# Patient Record
Sex: Female | Born: 1935 | Race: White | Hispanic: No | Smoking: Former smoker
Health system: Southern US, Community
[De-identification: ages and names within clinical notes are randomized; demographics above are authoritative.]

## PROBLEM LIST (undated history)

## (undated) DIAGNOSIS — E119 Type 2 diabetes mellitus without complications: Secondary | ICD-10-CM

## (undated) DIAGNOSIS — I10 Essential (primary) hypertension: Secondary | ICD-10-CM

## (undated) DIAGNOSIS — I509 Heart failure, unspecified: Secondary | ICD-10-CM

## (undated) DIAGNOSIS — F0390 Unspecified dementia without behavioral disturbance: Secondary | ICD-10-CM

---

## 1997-11-24 ENCOUNTER — Ambulatory Visit (HOSPITAL_COMMUNITY): Admission: RE | Admit: 1997-11-24 | Discharge: 1997-11-24 | Payer: Self-pay | Admitting: Cardiology

## 1998-09-03 ENCOUNTER — Inpatient Hospital Stay (HOSPITAL_COMMUNITY): Admission: EM | Admit: 1998-09-03 | Discharge: 1998-09-06 | Payer: Self-pay | Admitting: Emergency Medicine

## 1998-09-03 ENCOUNTER — Encounter: Payer: Self-pay | Admitting: Cardiology

## 2004-01-12 ENCOUNTER — Ambulatory Visit: Payer: Self-pay | Admitting: Cardiology

## 2004-08-16 ENCOUNTER — Encounter: Admission: RE | Admit: 2004-08-16 | Discharge: 2004-08-16 | Payer: Self-pay | Admitting: Neurosurgery

## 2004-12-17 ENCOUNTER — Ambulatory Visit: Payer: Self-pay | Admitting: Pulmonary Disease

## 2006-02-02 ENCOUNTER — Ambulatory Visit: Payer: Self-pay | Admitting: Pulmonary Disease

## 2006-02-04 ENCOUNTER — Ambulatory Visit: Payer: Self-pay | Admitting: Pulmonary Disease

## 2006-08-25 ENCOUNTER — Ambulatory Visit: Payer: Self-pay | Admitting: Pulmonary Disease

## 2006-08-25 LAB — CONVERTED CEMR LAB
ALT: 14 units/L (ref 0–40)
AST: 20 units/L (ref 0–37)
Basophils Relative: 0.1 % (ref 0.0–1.0)
Bilirubin, Direct: 0.1 mg/dL (ref 0.0–0.3)
CO2: 29 meq/L (ref 19–32)
Calcium: 9.2 mg/dL (ref 8.4–10.5)
Chloride: 101 meq/L (ref 96–112)
Creatinine, Ser: 1 mg/dL (ref 0.4–1.2)
Eosinophils Relative: 0.5 % (ref 0.0–5.0)
Glucose, Bld: 113 mg/dL — ABNORMAL HIGH (ref 70–99)
HCT: 38.3 % (ref 36.0–46.0)
Ketones, ur: NEGATIVE mg/dL
Neutrophils Relative %: 74.4 % (ref 43.0–77.0)
Nitrite: NEGATIVE
Platelets: 209 10*3/uL (ref 150–400)
RBC: 4.15 M/uL (ref 3.87–5.11)
Specific Gravity, Urine: 1.025 (ref 1.000–1.03)
Total Bilirubin: 0.7 mg/dL (ref 0.3–1.2)
Total Protein, Urine: NEGATIVE mg/dL
Total Protein: 7 g/dL (ref 6.0–8.3)
Urine Glucose: NEGATIVE mg/dL
Urobilinogen, UA: 0.2 (ref 0.0–1.0)
WBC: 6.7 10*3/uL (ref 4.5–10.5)

## 2006-09-01 ENCOUNTER — Ambulatory Visit: Payer: Self-pay | Admitting: Pulmonary Disease

## 2006-12-24 DIAGNOSIS — C679 Malignant neoplasm of bladder, unspecified: Secondary | ICD-10-CM | POA: Insufficient documentation

## 2006-12-24 DIAGNOSIS — M1990 Unspecified osteoarthritis, unspecified site: Secondary | ICD-10-CM | POA: Insufficient documentation

## 2006-12-24 DIAGNOSIS — I2510 Atherosclerotic heart disease of native coronary artery without angina pectoris: Secondary | ICD-10-CM | POA: Insufficient documentation

## 2006-12-24 DIAGNOSIS — J209 Acute bronchitis, unspecified: Secondary | ICD-10-CM

## 2007-10-07 ENCOUNTER — Telehealth (INDEPENDENT_AMBULATORY_CARE_PROVIDER_SITE_OTHER): Payer: Self-pay | Admitting: *Deleted

## 2007-10-19 ENCOUNTER — Ambulatory Visit: Payer: Self-pay | Admitting: Internal Medicine

## 2007-10-19 ENCOUNTER — Encounter: Payer: Self-pay | Admitting: Pulmonary Disease

## 2007-10-19 LAB — CONVERTED CEMR LAB
ALT: 26 units/L (ref 0–35)
Basophils Absolute: 0.1 10*3/uL (ref 0.0–0.1)
Bilirubin, Direct: 0.1 mg/dL (ref 0.0–0.3)
CO2: 30 meq/L (ref 19–32)
Calcium: 9.2 mg/dL (ref 8.4–10.5)
Hemoglobin: 13.4 g/dL (ref 12.0–15.0)
Lymphocytes Relative: 14.8 % (ref 12.0–46.0)
MCHC: 34 g/dL (ref 30.0–36.0)
Neutro Abs: 7.5 10*3/uL (ref 1.4–7.7)
Neutrophils Relative %: 76.6 % (ref 43.0–77.0)
Platelets: 247 10*3/uL (ref 150–400)
RDW: 13 % (ref 11.5–14.6)
Sodium: 140 meq/L (ref 135–145)
TSH: 2.52 microintl units/mL (ref 0.35–5.50)
Total Bilirubin: 0.8 mg/dL (ref 0.3–1.2)

## 2007-11-03 LAB — CONVERTED CEMR LAB: Vit D, 1,25-Dihydroxy: 31 (ref 30–89)

## 2008-02-29 ENCOUNTER — Inpatient Hospital Stay (HOSPITAL_COMMUNITY): Admission: EM | Admit: 2008-02-29 | Discharge: 2008-03-07 | Payer: Self-pay | Admitting: Emergency Medicine

## 2008-02-29 ENCOUNTER — Ambulatory Visit: Payer: Self-pay | Admitting: Vascular Surgery

## 2008-02-29 ENCOUNTER — Encounter: Payer: Self-pay | Admitting: Pulmonary Disease

## 2008-02-29 ENCOUNTER — Ambulatory Visit: Payer: Self-pay | Admitting: Internal Medicine

## 2008-02-29 ENCOUNTER — Ambulatory Visit: Payer: Self-pay | Admitting: Pulmonary Disease

## 2008-03-01 ENCOUNTER — Encounter: Payer: Self-pay | Admitting: Pulmonary Disease

## 2008-03-15 ENCOUNTER — Telehealth (INDEPENDENT_AMBULATORY_CARE_PROVIDER_SITE_OTHER): Payer: Self-pay | Admitting: *Deleted

## 2008-03-20 ENCOUNTER — Ambulatory Visit: Payer: Self-pay | Admitting: Pulmonary Disease

## 2008-03-20 DIAGNOSIS — I5032 Chronic diastolic (congestive) heart failure: Secondary | ICD-10-CM | POA: Insufficient documentation

## 2008-03-20 DIAGNOSIS — S0990XA Unspecified injury of head, initial encounter: Secondary | ICD-10-CM | POA: Insufficient documentation

## 2008-03-20 DIAGNOSIS — I4891 Unspecified atrial fibrillation: Secondary | ICD-10-CM

## 2008-03-20 DIAGNOSIS — Z950 Presence of cardiac pacemaker: Secondary | ICD-10-CM

## 2008-03-20 DIAGNOSIS — I872 Venous insufficiency (chronic) (peripheral): Secondary | ICD-10-CM | POA: Insufficient documentation

## 2008-03-20 DIAGNOSIS — K219 Gastro-esophageal reflux disease without esophagitis: Secondary | ICD-10-CM

## 2008-03-20 DIAGNOSIS — F0280 Dementia in other diseases classified elsewhere without behavioral disturbance: Secondary | ICD-10-CM

## 2008-03-20 DIAGNOSIS — Q211 Atrial septal defect: Secondary | ICD-10-CM

## 2008-03-20 DIAGNOSIS — K5900 Constipation, unspecified: Secondary | ICD-10-CM | POA: Insufficient documentation

## 2008-03-20 DIAGNOSIS — G309 Alzheimer's disease, unspecified: Secondary | ICD-10-CM

## 2008-03-20 LAB — CONVERTED CEMR LAB
Basophils Relative: 4.9 % — ABNORMAL HIGH (ref 0.0–3.0)
CO2: 33 meq/L — ABNORMAL HIGH (ref 19–32)
Calcium: 9.1 mg/dL (ref 8.4–10.5)
Creatinine, Ser: 1 mg/dL (ref 0.4–1.2)
Glucose, Bld: 124 mg/dL — ABNORMAL HIGH (ref 70–99)
Hemoglobin: 13 g/dL (ref 12.0–15.0)
Lymphocytes Relative: 15.6 % (ref 12.0–46.0)
MCHC: 33.5 g/dL (ref 30.0–36.0)
Monocytes Relative: 7.8 % (ref 3.0–12.0)
Neutro Abs: 5.7 10*3/uL (ref 1.4–7.7)
RBC: 4.16 M/uL (ref 3.87–5.11)

## 2008-04-02 ENCOUNTER — Emergency Department (HOSPITAL_COMMUNITY): Admission: EM | Admit: 2008-04-02 | Discharge: 2008-04-02 | Payer: Self-pay | Admitting: Emergency Medicine

## 2008-05-25 ENCOUNTER — Ambulatory Visit: Payer: Self-pay | Admitting: Pulmonary Disease

## 2008-05-25 ENCOUNTER — Inpatient Hospital Stay (HOSPITAL_COMMUNITY): Admission: EM | Admit: 2008-05-25 | Discharge: 2008-05-29 | Payer: Self-pay | Admitting: Emergency Medicine

## 2008-05-25 ENCOUNTER — Telehealth (INDEPENDENT_AMBULATORY_CARE_PROVIDER_SITE_OTHER): Payer: Self-pay | Admitting: *Deleted

## 2008-05-26 ENCOUNTER — Telehealth: Payer: Self-pay | Admitting: Pulmonary Disease

## 2008-06-07 ENCOUNTER — Encounter: Payer: Self-pay | Admitting: Pulmonary Disease

## 2008-06-08 ENCOUNTER — Ambulatory Visit: Payer: Self-pay | Admitting: Pulmonary Disease

## 2008-06-23 ENCOUNTER — Encounter: Payer: Self-pay | Admitting: Pulmonary Disease

## 2008-09-06 ENCOUNTER — Ambulatory Visit: Payer: Self-pay | Admitting: Pulmonary Disease

## 2008-09-06 DIAGNOSIS — R350 Frequency of micturition: Secondary | ICD-10-CM

## 2008-09-07 ENCOUNTER — Encounter: Payer: Self-pay | Admitting: Pulmonary Disease

## 2008-09-15 LAB — CONVERTED CEMR LAB
ALT: 17 units/L (ref 0–35)
Alkaline Phosphatase: 95 units/L (ref 39–117)
Basophils Absolute: 0 10*3/uL (ref 0.0–0.1)
Bilirubin Urine: NEGATIVE
CO2: 34 meq/L — ABNORMAL HIGH (ref 19–32)
Calcium: 9 mg/dL (ref 8.4–10.5)
Creatinine, Ser: 1 mg/dL (ref 0.4–1.2)
Eosinophils Absolute: 0.1 10*3/uL (ref 0.0–0.7)
Glucose, Bld: 92 mg/dL (ref 70–99)
Hemoglobin: 13.1 g/dL (ref 12.0–15.0)
Ketones, ur: NEGATIVE mg/dL
Lymphocytes Relative: 20.8 % (ref 12.0–46.0)
MCHC: 34.6 g/dL (ref 30.0–36.0)
Monocytes Absolute: 0.8 10*3/uL (ref 0.1–1.0)
Neutro Abs: 5.6 10*3/uL (ref 1.4–7.7)
Neutrophils Relative %: 67.9 % (ref 43.0–77.0)
RDW: 12.9 % (ref 11.5–14.6)
Specific Gravity, Urine: 1.01 (ref 1.000–1.030)
TSH: 2.5 microintl units/mL (ref 0.35–5.50)
Total Protein: 7.4 g/dL (ref 6.0–8.3)
Urine Glucose: NEGATIVE mg/dL
pH: 7 (ref 5.0–8.0)

## 2009-01-12 ENCOUNTER — Encounter: Payer: Self-pay | Admitting: Pulmonary Disease

## 2009-06-01 ENCOUNTER — Encounter: Payer: Self-pay | Admitting: Pulmonary Disease

## 2009-06-07 ENCOUNTER — Encounter: Payer: Self-pay | Admitting: Pulmonary Disease

## 2009-10-12 ENCOUNTER — Encounter: Payer: Self-pay | Admitting: Pulmonary Disease

## 2010-04-16 NOTE — Miscellaneous (Signed)
Summary: [facility]   Imported By: Lester Shackelford 10/18/2009 09:41:58  _____________________________________________________________________  External Attachment:    Type:   Image     Comment:   External Document

## 2010-04-16 NOTE — Miscellaneous (Signed)
Summary: Physician Auth & Care Plan/[facility]  Physician Auth & Care Plan/[facility]   Imported By: Sherian Rein 06/13/2009 12:11:14  _____________________________________________________________________  External Attachment:    Type:   Image     Comment:   External Document

## 2010-04-16 NOTE — Miscellaneous (Signed)
[place] /[facility]  [place] /[facility]   Imported By: Sherian Rein 06/06/2009 13:17:18  _____________________________________________________________________  External Attachment:    Type:   Image     Comment:   External Document

## 2010-04-16 NOTE — Miscellaneous (Signed)
[place]  [place]/[facility]   Imported By: Sherian Rein 06/13/2009 12:12:12  _____________________________________________________________________  External Attachment:    Type:   Image     Comment:   External Document

## 2010-04-16 NOTE — Miscellaneous (Signed)
Summary: Care Plan/[facility]   Imported By: Sherian Rein 06/06/2009 13:18:28  _____________________________________________________________________  External Attachment:    Type:   Image     Comment:   External Document

## 2010-06-27 LAB — TROPONIN I: Troponin I: 0.24 ng/mL — ABNORMAL HIGH (ref 0.00–0.06)

## 2010-06-27 LAB — CBC
HCT: 35.3 % — ABNORMAL LOW (ref 36.0–46.0)
HCT: 35.3 % — ABNORMAL LOW (ref 36.0–46.0)
Hemoglobin: 11.7 g/dL — ABNORMAL LOW (ref 12.0–15.0)
Hemoglobin: 12.7 g/dL (ref 12.0–15.0)
MCHC: 33.2 g/dL (ref 30.0–36.0)
MCHC: 33.4 g/dL (ref 30.0–36.0)
MCV: 92.8 fL (ref 78.0–100.0)
Platelets: 196 10*3/uL (ref 150–400)
RBC: 4.14 MIL/uL (ref 3.87–5.11)
RDW: 15.1 % (ref 11.5–15.5)
RDW: 15.2 % (ref 11.5–15.5)
WBC: 7.5 10*3/uL (ref 4.0–10.5)

## 2010-06-27 LAB — COMPREHENSIVE METABOLIC PANEL
ALT: 14 U/L (ref 0–35)
AST: 17 U/L (ref 0–37)
Alkaline Phosphatase: 77 U/L (ref 39–117)
BUN: 8 mg/dL (ref 6–23)
CO2: 29 mEq/L (ref 19–32)
Calcium: 9.1 mg/dL (ref 8.4–10.5)
Creatinine, Ser: 0.93 mg/dL (ref 0.4–1.2)
GFR calc Af Amer: >60 mL/min (ref >60)
GFR calc non Af Amer: 58 mL/min — ABNORMAL LOW (ref >60)
Glucose, Bld: 117 mg/dL — ABNORMAL HIGH (ref 70–99)
Potassium: 4.1 mEq/L (ref 3.5–5.1)
Sodium: 138 mEq/L (ref 135–145)
Total Protein: 6.4 g/dL (ref 6.0–8.3)

## 2010-06-27 LAB — URINALYSIS, ROUTINE W REFLEX MICROSCOPIC
Nitrite: NEGATIVE
Protein, ur: NEGATIVE mg/dL
Urobilinogen, UA: 1 mg/dL (ref 0.0–1.0)

## 2010-06-27 LAB — DIFFERENTIAL
Basophils Relative: 0 % (ref 0–1)
Eosinophils Absolute: 0 10*3/uL (ref 0.0–0.7)
Eosinophils Relative: 2 % (ref 0–5)
Lymphocytes Relative: 22 % (ref 12–46)
Lymphs Abs: 1.6 10*3/uL (ref 0.7–4.0)
Monocytes Absolute: 0.6 10*3/uL (ref 0.1–1.0)
Monocytes Relative: 8 % (ref 3–12)
Neutro Abs: 4.7 10*3/uL (ref 1.7–7.7)
Neutrophils Relative %: 77 % (ref 43–77)

## 2010-06-27 LAB — CULTURE, BLOOD (ROUTINE X 2): Culture: NO GROWTH

## 2010-06-27 LAB — BASIC METABOLIC PANEL
CO2: 28 mEq/L (ref 19–32)
Chloride: 103 mEq/L (ref 96–112)
Glucose, Bld: 101 mg/dL — ABNORMAL HIGH (ref 70–99)
Potassium: 3.6 mEq/L (ref 3.5–5.1)
Sodium: 138 mEq/L (ref 135–145)

## 2010-06-27 LAB — URINE CULTURE

## 2010-06-27 LAB — PHOSPHORUS: Phosphorus: 2.6 mg/dL (ref 2.3–4.6)

## 2010-06-27 LAB — URINE MICROSCOPIC-ADD ON

## 2010-06-27 LAB — CK TOTAL AND CKMB (NOT AT ARMC): Total CK: 46 U/L (ref 7–177)

## 2010-07-26 ENCOUNTER — Emergency Department (HOSPITAL_COMMUNITY)
Admission: EM | Admit: 2010-07-26 | Discharge: 2010-07-27 | Disposition: A | Discharge status: Home / Self Care | Payer: Medicare Other | Attending: Emergency Medicine | Admitting: Emergency Medicine

## 2010-07-26 DIAGNOSIS — I509 Heart failure, unspecified: Secondary | ICD-10-CM | POA: Insufficient documentation

## 2010-07-26 DIAGNOSIS — H109 Unspecified conjunctivitis: Secondary | ICD-10-CM | POA: Insufficient documentation

## 2010-07-26 DIAGNOSIS — Z7982 Long term (current) use of aspirin: Secondary | ICD-10-CM | POA: Insufficient documentation

## 2010-07-26 DIAGNOSIS — Z79899 Other long term (current) drug therapy: Secondary | ICD-10-CM | POA: Insufficient documentation

## 2010-07-26 DIAGNOSIS — E7800 Pure hypercholesterolemia, unspecified: Secondary | ICD-10-CM | POA: Insufficient documentation

## 2010-07-26 DIAGNOSIS — Z950 Presence of cardiac pacemaker: Secondary | ICD-10-CM | POA: Insufficient documentation

## 2010-07-26 DIAGNOSIS — I4891 Unspecified atrial fibrillation: Secondary | ICD-10-CM | POA: Insufficient documentation

## 2010-07-26 DIAGNOSIS — Z951 Presence of aortocoronary bypass graft: Secondary | ICD-10-CM | POA: Insufficient documentation

## 2010-07-26 DIAGNOSIS — F0390 Unspecified dementia without behavioral disturbance: Secondary | ICD-10-CM | POA: Insufficient documentation

## 2010-07-26 DIAGNOSIS — K219 Gastro-esophageal reflux disease without esophagitis: Secondary | ICD-10-CM | POA: Insufficient documentation
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
## 2010-12-20 LAB — CBC
HCT: 37.3 % (ref 36.0–46.0)
Hemoglobin: 12 g/dL (ref 12.0–15.0)
Hemoglobin: 12.3 g/dL (ref 12.0–15.0)
Hemoglobin: 12.5 g/dL (ref 12.0–15.0)
MCHC: 32.1 g/dL (ref 30.0–36.0)
MCHC: 32.2 g/dL (ref 30.0–36.0)
MCHC: 32.4 g/dL (ref 30.0–36.0)
MCHC: 32.7 g/dL (ref 30.0–36.0)
MCV: 93.7 fL (ref 78.0–100.0)
MCV: 93.7 fL (ref 78.0–100.0)
MCV: 93.8 fL (ref 78.0–100.0)
MCV: 93.9 fL (ref 78.0–100.0)
Platelets: 242 10*3/uL (ref 150–400)
Platelets: 253 10*3/uL (ref 150–400)
Platelets: 256 10*3/uL (ref 150–400)
Platelets: 258 10*3/uL (ref 150–400)
RBC: 3.79 MIL/uL — ABNORMAL LOW (ref 3.87–5.11)
RBC: 3.97 MIL/uL (ref 3.87–5.11)
RBC: 3.98 MIL/uL (ref 3.87–5.11)
RBC: 4.05 MIL/uL (ref 3.87–5.11)
RDW: 13.9 % (ref 11.5–15.5)
RDW: 14.3 % (ref 11.5–15.5)
WBC: 6.2 10*3/uL (ref 4.0–10.5)
WBC: 7.3 10*3/uL (ref 4.0–10.5)
WBC: 7.5 10*3/uL (ref 4.0–10.5)
WBC: 7.9 10*3/uL (ref 4.0–10.5)

## 2010-12-20 LAB — URINALYSIS, ROUTINE W REFLEX MICROSCOPIC
Bilirubin Urine: NEGATIVE
Glucose, UA: NEGATIVE mg/dL
Ketones, ur: NEGATIVE mg/dL
Nitrite: NEGATIVE
Protein, ur: 30 mg/dL — AB
Specific Gravity, Urine: 1.021 (ref 1.005–1.030)
Urobilinogen, UA: 1 mg/dL (ref 0.0–1.0)
pH: 6 (ref 5.0–8.0)

## 2010-12-20 LAB — BASIC METABOLIC PANEL
BUN: 16 mg/dL (ref 6–23)
BUN: 22 mg/dL (ref 6–23)
BUN: 22 mg/dL (ref 6–23)
CO2: 31 mEq/L (ref 19–32)
CO2: 34 mEq/L — ABNORMAL HIGH (ref 19–32)
Calcium: 8.8 mg/dL (ref 8.4–10.5)
Calcium: 8.9 mg/dL (ref 8.4–10.5)
Calcium: 8.9 mg/dL (ref 8.4–10.5)
Calcium: 8.9 mg/dL (ref 8.4–10.5)
Chloride: 103 mEq/L (ref 96–112)
Chloride: 99 mEq/L (ref 96–112)
Creatinine, Ser: 1.03 mg/dL (ref 0.4–1.2)
Creatinine, Ser: 1.05 mg/dL (ref 0.4–1.2)
Creatinine, Ser: 1.05 mg/dL (ref 0.4–1.2)
Creatinine, Ser: 1.13 mg/dL (ref 0.4–1.2)
GFR calc Af Amer: 56 mL/min — ABNORMAL LOW (ref >60)
GFR calc Af Amer: 59 mL/min — ABNORMAL LOW (ref >60)
GFR calc Af Amer: >60 mL/min (ref >60)
GFR calc non Af Amer: 46 mL/min — ABNORMAL LOW (ref >60)
GFR calc non Af Amer: 49 mL/min — ABNORMAL LOW (ref >60)
Potassium: 3.7 mEq/L (ref 3.5–5.1)
Sodium: 136 mEq/L (ref 135–145)

## 2010-12-20 LAB — POCT I-STAT, CHEM 8
BUN: 17 mg/dL (ref 6–23)
Calcium, Ion: 1.21 mmol/L (ref 1.12–1.32)
Chloride: 102 meq/L (ref 96–112)
Creatinine, Ser: 1 mg/dL (ref 0.4–1.2)
Glucose, Bld: 103 mg/dL — ABNORMAL HIGH (ref 70–99)
HCT: 39 % (ref 36.0–46.0)
Hemoglobin: 13.3 g/dL (ref 12.0–15.0)
Potassium: 4.4 meq/L (ref 3.5–5.1)
Sodium: 141 meq/L (ref 135–145)
TCO2: 30 mmol/L (ref 0–100)

## 2010-12-20 LAB — HEPATIC FUNCTION PANEL
AST: 26 U/L (ref 0–37)
Albumin: 3.2 g/dL — ABNORMAL LOW (ref 3.5–5.2)
Bilirubin, Direct: 0.1 mg/dL (ref 0.0–0.3)
Total Bilirubin: 0.4 mg/dL (ref 0.3–1.2)

## 2010-12-20 LAB — PROTIME-INR
INR: 1 (ref 0.00–1.49)
INR: 1.1 (ref 0.00–1.49)
INR: 1.2 (ref 0.00–1.49)
INR: 1.6 — ABNORMAL HIGH (ref 0.00–1.49)
Prothrombin Time: 13.6 seconds (ref 11.6–15.2)
Prothrombin Time: 14.2 seconds (ref 11.6–15.2)
Prothrombin Time: 15.7 seconds — ABNORMAL HIGH (ref 11.6–15.2)

## 2010-12-20 LAB — URINE MICROSCOPIC-ADD ON

## 2010-12-20 LAB — APTT: aPTT: 37 seconds (ref 24–37)

## 2010-12-20 LAB — DIGOXIN LEVEL: Digoxin Level: <0.2 ng/mL — ABNORMAL LOW (ref 0.8–2.0)

## 2010-12-20 LAB — B-NATRIURETIC PEPTIDE (CONVERTED LAB)
Pro B Natriuretic peptide (BNP): 175 pg/mL — ABNORMAL HIGH (ref 0.0–100.0)
Pro B Natriuretic peptide (BNP): 231 pg/mL — ABNORMAL HIGH (ref 0.0–100.0)
Pro B Natriuretic peptide (BNP): 462 pg/mL — ABNORMAL HIGH (ref 0.0–100.0)

## 2010-12-20 LAB — POCT CARDIAC MARKERS
CKMB, poc: 1.6 ng/mL (ref 1.0–8.0)
Myoglobin, poc: 84.3 ng/mL (ref 12–200)
Troponin i, poc: <0.05 ng/mL (ref 0.00–0.09)

## 2010-12-20 LAB — TSH: TSH: 5.144 u[IU]/mL — ABNORMAL HIGH (ref 0.350–4.500)

## 2010-12-20 LAB — T3: T3, Total: 143.1 ng/dl (ref 80.0–204.0)

## 2013-05-09 ENCOUNTER — Inpatient Hospital Stay (HOSPITAL_COMMUNITY)
Admission: EM | Admit: 2013-05-09 | Discharge: 2013-05-15 | DRG: 291 | Disposition: E | Payer: Medicare Other | Attending: Internal Medicine | Admitting: Internal Medicine

## 2013-05-09 ENCOUNTER — Encounter (HOSPITAL_COMMUNITY): Payer: Self-pay | Admitting: Emergency Medicine

## 2013-05-09 ENCOUNTER — Emergency Department (HOSPITAL_COMMUNITY): Payer: Medicare Other

## 2013-05-09 DIAGNOSIS — C679 Malignant neoplasm of bladder, unspecified: Secondary | ICD-10-CM

## 2013-05-09 DIAGNOSIS — Z7982 Long term (current) use of aspirin: Secondary | ICD-10-CM

## 2013-05-09 DIAGNOSIS — Q2111 Secundum atrial septal defect: Secondary | ICD-10-CM

## 2013-05-09 DIAGNOSIS — I4891 Unspecified atrial fibrillation: Secondary | ICD-10-CM | POA: Diagnosis present

## 2013-05-09 DIAGNOSIS — F0280 Dementia in other diseases classified elsewhere without behavioral disturbance: Secondary | ICD-10-CM | POA: Diagnosis present

## 2013-05-09 DIAGNOSIS — Z87891 Personal history of nicotine dependence: Secondary | ICD-10-CM

## 2013-05-09 DIAGNOSIS — G309 Alzheimer's disease, unspecified: Secondary | ICD-10-CM | POA: Diagnosis present

## 2013-05-09 DIAGNOSIS — I5033 Acute on chronic diastolic (congestive) heart failure: Principal | ICD-10-CM | POA: Diagnosis present

## 2013-05-09 DIAGNOSIS — Z79899 Other long term (current) drug therapy: Secondary | ICD-10-CM

## 2013-05-09 DIAGNOSIS — I10 Essential (primary) hypertension: Secondary | ICD-10-CM | POA: Diagnosis present

## 2013-05-09 DIAGNOSIS — Z66 Do not resuscitate: Secondary | ICD-10-CM | POA: Diagnosis present

## 2013-05-09 DIAGNOSIS — I872 Venous insufficiency (chronic) (peripheral): Secondary | ICD-10-CM

## 2013-05-09 DIAGNOSIS — J189 Pneumonia, unspecified organism: Secondary | ICD-10-CM | POA: Diagnosis present

## 2013-05-09 DIAGNOSIS — E119 Type 2 diabetes mellitus without complications: Secondary | ICD-10-CM | POA: Diagnosis present

## 2013-05-09 DIAGNOSIS — I509 Heart failure, unspecified: Secondary | ICD-10-CM | POA: Diagnosis present

## 2013-05-09 DIAGNOSIS — K219 Gastro-esophageal reflux disease without esophagitis: Secondary | ICD-10-CM | POA: Diagnosis present

## 2013-05-09 DIAGNOSIS — Q211 Atrial septal defect: Secondary | ICD-10-CM

## 2013-05-09 DIAGNOSIS — Z950 Presence of cardiac pacemaker: Secondary | ICD-10-CM

## 2013-05-09 HISTORY — DX: Unspecified dementia, unspecified severity, without behavioral disturbance, psychotic disturbance, mood disturbance, and anxiety: F03.90

## 2013-05-09 HISTORY — DX: Type 2 diabetes mellitus without complications: E11.9

## 2013-05-09 HISTORY — DX: Essential (primary) hypertension: I10

## 2013-05-09 HISTORY — DX: Heart failure, unspecified: I50.9

## 2013-05-09 LAB — COMPREHENSIVE METABOLIC PANEL
ALK PHOS: 83 U/L (ref 39–117)
ALT: 21 U/L (ref 0–35)
AST: 20 U/L (ref 0–37)
Albumin: 2.9 g/dL — ABNORMAL LOW (ref 3.5–5.2)
BILIRUBIN TOTAL: 0.7 mg/dL (ref 0.3–1.2)
BUN: 20 mg/dL (ref 6–23)
CO2: 29 meq/L (ref 19–32)
Calcium: 9.1 mg/dL (ref 8.4–10.5)
Chloride: 101 mEq/L (ref 96–112)
Creatinine, Ser: 0.7 mg/dL (ref 0.50–1.10)
GFR, EST AFRICAN AMERICAN: 88 mL/min — AB (ref >90)
GFR, EST NON AFRICAN AMERICAN: 76 mL/min — AB (ref >90)
GLUCOSE: 109 mg/dL — AB (ref 70–99)
POTASSIUM: 4.6 meq/L (ref 3.7–5.3)
SODIUM: 142 meq/L (ref 137–147)
Total Protein: 7.4 g/dL (ref 6.0–8.3)

## 2013-05-09 LAB — CBC WITH DIFFERENTIAL/PLATELET
Basophils Absolute: 0 10*3/uL (ref 0.0–0.1)
Basophils Relative: 0 % (ref 0–1)
Eosinophils Absolute: 0 10*3/uL (ref 0.0–0.7)
Eosinophils Relative: 0 % (ref 0–5)
HCT: 36.3 % (ref 36.0–46.0)
HEMOGLOBIN: 11.6 g/dL — AB (ref 12.0–15.0)
LYMPHS ABS: 0.9 10*3/uL (ref 0.7–4.0)
LYMPHS PCT: 5 % — AB (ref 12–46)
MCH: 29.9 pg (ref 26.0–34.0)
MCHC: 32 g/dL (ref 30.0–36.0)
MCV: 93.6 fL (ref 78.0–100.0)
MONOS PCT: 10 % (ref 3–12)
Monocytes Absolute: 1.7 10*3/uL — ABNORMAL HIGH (ref 0.1–1.0)
NEUTROS PCT: 85 % — AB (ref 43–77)
Neutro Abs: 14.2 10*3/uL — ABNORMAL HIGH (ref 1.7–7.7)
PLATELETS: 253 10*3/uL (ref 150–400)
RBC: 3.88 MIL/uL (ref 3.87–5.11)
RDW: 16.1 % — ABNORMAL HIGH (ref 11.5–15.5)
WBC: 16.8 10*3/uL — AB (ref 4.0–10.5)

## 2013-05-09 LAB — URINALYSIS, ROUTINE W REFLEX MICROSCOPIC
BILIRUBIN URINE: NEGATIVE
GLUCOSE, UA: NEGATIVE mg/dL
KETONES UR: NEGATIVE mg/dL
Nitrite: NEGATIVE
PROTEIN: NEGATIVE mg/dL
Specific Gravity, Urine: 1.017 (ref 1.005–1.030)
UROBILINOGEN UA: 1 mg/dL (ref 0.0–1.0)
pH: 5.5 (ref 5.0–8.0)

## 2013-05-09 LAB — URINE MICROSCOPIC-ADD ON

## 2013-05-09 LAB — TROPONIN I: Troponin I: <0.3 ng/mL (ref <0.30)

## 2013-05-09 LAB — DIGOXIN LEVEL: Digoxin Level: 0.7 ng/mL — ABNORMAL LOW (ref 0.8–2.0)

## 2013-05-09 LAB — PRO B NATRIURETIC PEPTIDE: PRO B NATRI PEPTIDE: 2845 pg/mL — AB (ref 0–450)

## 2013-05-09 MED ORDER — DIGOXIN 125 MCG PO TABS
0.2500 mg | ORAL_TABLET | Freq: Once | ORAL | Status: AC
  Administered 2013-05-09: 0.25 mg via ORAL
  Filled 2013-05-09: qty 2

## 2013-05-09 MED ORDER — VANCOMYCIN HCL IN DEXTROSE 750-5 MG/150ML-% IV SOLN
750.0000 mg | INTRAVENOUS | Status: DC
  Administered 2013-05-09: 750 mg via INTRAVENOUS
  Filled 2013-05-09 (×2): qty 150

## 2013-05-09 MED ORDER — FUROSEMIDE 10 MG/ML IJ SOLN
40.0000 mg | Freq: Once | INTRAMUSCULAR | Status: AC
  Administered 2013-05-09: 40 mg via INTRAVENOUS
  Filled 2013-05-09: qty 4

## 2013-05-09 MED ORDER — DEXTROSE 5 % IV SOLN
1.0000 g | INTRAVENOUS | Status: DC
  Administered 2013-05-09: 1 g via INTRAVENOUS
  Filled 2013-05-09 (×3): qty 1

## 2013-05-09 MED ORDER — DILTIAZEM HCL 25 MG/5ML IV SOLN
10.0000 mg | Freq: Once | INTRAVENOUS | Status: AC
  Administered 2013-05-09: 10 mg via INTRAVENOUS
  Filled 2013-05-09: qty 5

## 2013-05-09 NOTE — ED Provider Notes (Addendum)
CSN: [id number]     Arrival date & time 04/23/2013  1757 History   First MD Initiated Contact with Patient 04/24/2013 1801     Chief Complaint  Patient presents with  . Leg Swelling     (Consider location/radiation/quality/duration/timing/severity/associated sxs/prior Treatment) HPI Comments: Patient presents to the ER for evaluation of bilateral lower extremity swelling. Symptoms present for an unknown period of time. She is sent to the ER from the nursing home where she resides. Patient has also been noted to have discoloration of the fingertips on her hands.  Patient does have a history of dementia, is not a very good historian. She currently denies chest pain, shortness of breath, hand pain, leg pain. Level V Caveat due to Dementia.   No past medical history on file. No past surgical history on file. No family history on file. History  Substance Use Topics  . Smoking status: Not on file  . Smokeless tobacco: Not on file  . Alcohol Use: Not on file   OB History   No data available     Review of Systems  Unable to perform ROS: Dementia      Allergies  Codeine  Home Medications   Current Outpatient Rx  Name  Route  Sig  Dispense  Refill  . aspirin EC 81 MG tablet   Oral   Take 81 mg by mouth daily.         . digoxin (LANOXIN) 0.125 MG tablet   Oral   Take 0.125 mg by mouth daily.         . furosemide (LASIX) 20 MG tablet   Oral   Take 20 mg by mouth daily.         . hydrocerin (EUCERIN) CREA   Topical   Apply 1 application topically 2 (two) times daily. To legs and feet         . Melatonin 3 MG TABS   Oral   Take 1 tablet by mouth at bedtime.         . metFORMIN (GLUCOPHAGE) 500 MG tablet   Oral   Take 500 mg by mouth 2 (two) times daily with a meal.         . Multiple Vitamin (TAB-A-VITE PO)   Oral   Take 1 tablet by mouth daily.         . potassium chloride SA (K-DUR,KLOR-CON) 20 MEQ tablet   Oral   Take 20 mEq by mouth 2 (two) times  daily.         . verapamil (CALAN-SR) 240 MG CR tablet   Oral   Take 240 mg by mouth daily.          BP 118/65  Pulse 106  Temp(Src) 98.1 F (36.7 C) (Oral)  Resp 24  Ht 5' 1.81" (1.57 m)  Wt 120 lb (54.432 kg)  BMI 22.08 kg/m2  SpO2 97% Physical Exam  Constitutional: She is oriented to person, place, and time. She appears well-developed and well-nourished. No distress.  HENT:  Head: Normocephalic and atraumatic.  Right Ear: Hearing normal.  Left Ear: Hearing normal.  Nose: Nose normal.  Mouth/Throat: Oropharynx is clear and moist and mucous membranes are normal.  Eyes: Conjunctivae and EOM are normal. Pupils are equal, round, and reactive to light.  Neck: Normal range of motion. Neck supple.  Cardiovascular: S1 normal and S2 normal.  An irregularly irregular rhythm present. Tachycardia present.  Exam reveals no gallop and no friction rub.   No murmur  heard. Pulmonary/Chest: Accessory muscle usage present. Tachypnea noted. She has rales in the left lower field. She exhibits no tenderness.  Abdominal: Soft. Normal appearance and bowel sounds are normal. There is no hepatosplenomegaly. There is no tenderness. There is no rebound, no guarding, no tenderness at McBurney's point and negative Murphy's sign. No hernia.  Musculoskeletal: Normal range of motion. She exhibits edema (2-3+ bilateral).  Neurological: She is alert and oriented to person, place, and time. She has normal strength. No cranial nerve deficit or sensory deficit. Coordination normal. GCS eye subscore is 4. GCS verbal subscore is 5. GCS motor subscore is 6.  Skin: Skin is warm, dry and intact. No rash noted. No cyanosis.     Psychiatric: She has a normal mood and affect. Her speech is normal and behavior is normal. Thought content normal.    ED Course  Procedures (including critical care time) Labs Review Labs Reviewed  CBC WITH DIFFERENTIAL - Abnormal; Notable for the following:    WBC 16.8 (*)     Hemoglobin 11.6 (*)    RDW 16.1 (*)    Neutrophils Relative % 85 (*)    Neutro Abs 14.2 (*)    Lymphocytes Relative 5 (*)    Monocytes Absolute 1.7 (*)    All other components within normal limits  COMPREHENSIVE METABOLIC PANEL - Abnormal; Notable for the following:    Glucose, Bld 109 (*)    Albumin 2.9 (*)    GFR calc non Af Amer 76 (*)    GFR calc Af Amer 88 (*)    All other components within normal limits  PRO B NATRIURETIC PEPTIDE - Abnormal; Notable for the following:    Pro B Natriuretic peptide (BNP) 2845.0 (*)    All other components within normal limits  CULTURE, BLOOD (ROUTINE X 2)  CULTURE, BLOOD (ROUTINE X 2)  TROPONIN I  URINALYSIS, ROUTINE W REFLEX MICROSCOPIC  DIGOXIN LEVEL   Imaging Review Dg Chest 2 View  04/21/2013   CLINICAL DATA:  Lower extremity edema. Discoloration of the finger tips. Lung crackles. Dementia.  EXAM: CHEST  2 VIEW  COMPARISON:  DG CHEST 2 VIEW dated 05/25/2008; DG CHEST 1V PORT dated 02/28/2008; CT HEAD W/O CM dated 05/25/2008  FINDINGS: Moderate cardiomegaly noted. There is new airspace opacity in the left lower lobe obscuring the left hemidiaphragm. There is potentially a small left pleural effusion. Kerley B-lines are noted peripherally in the lungs, and there is a small amount of fluid in the minor fissure.  Prior median sternotomy. Dual lead pacer noted. Bony demineralization.  IMPRESSION: 1. Left lower lobe airspace opacity potentially from pneumonia or less likely confluent edema with left pleural effusion. Malignancy is not excluded, and followup radiography in the next 4 weeks time is recommended in order to ensure clearance after appropriate treatment. 2. Kerley B-lines in the lungs suggesting mild interstitial edema. Cardiomegaly noted.   Electronically Signed   By: Sherryl Barters M.D.   On: 05/03/2013 19:55    EKG Interpretation    Date/Time:  Monday May 09 2013 18:26:12 EST Ventricular Rate:  109 PR Interval:    QRS  Duration: 94 QT Interval:  269 QTC Calculation: 362 R Axis:   110 Text Interpretation:  Atrial fibrillation Probable RVH w/ secondary repol abnormality Incomplete right bundle branch block Nonspecific ST and T wave abnormality No significant change since last tracing Confirmed by Horris Speros  MD, Mckinzie Saksa ([id number]) on 04/18/2013 9:23:54 PM  MDM   Final diagnoses:  HCAP (healthcare-associated pneumonia)  CHF (congestive heart failure)  Atrial fibrillation with RVR    Patient presents to the ER for evaluation of swelling of her legs. She appears to be short of breath, though she denies it. She does have baseline dementia. She was tachypneic and had increased accessory muscle use with her breathing. She is not, however, hypoxic.  Patient's workup is consistent with acutely decompensated congestive heart failure with concomitant left lower lobe pneumonia. Patient currently resides in a nursing home, will be treated for healthcare associated pneumonia. She was also given additional Lasix.  Patient was found to be in atrial fibrillation with rapid ventricular response. She was borderline hypotensive febrile, but did tolerate a small bolus of Cardizem. Heart rate is now around 100 and her blood pressure is 161 systolic. I do not feel that she requires a Cardizem drip at this point. Digoxin level was low, was given by mouth digoxin  Addendum: Nursing staff to contact the patient's nursing home. Patient is a DO NOT RESUSCITATE. The DO NOT RESUSCITATE order was inadvertently left behind by EMS when they picked her up.  Orpah Greek, MD 2013-06-01 0960  Orpah Greek, MD June 01, 2013 [phone]

## 2013-05-09 NOTE — ED Notes (Addendum)
Pt reports to the ED for eva of bilateral leg swelling and purple discoloration to her fingertips. Capillary refill >3 seconds. Pt atrial fibrillation at a rate of 110s-120s on the monitor. Pt has +2 pitting edema in bilateral lower extremities. Crackles auscultated on left lower lung base. Denies cough. VSS en route. Pt is from [facility]. Pt does take Lasix. Pt ambulatory PTA. CHF is not listed in her medical hx. Pt alert and oriented at baseline. Pt has hx of dementia. Per SNF pt had crackles in the lower lung fields. Denies any CP or SOB. Denies any pain. Resp e/u and skin warm and dry.

## 2013-05-09 NOTE — Consult Note (Signed)
ANTIBIOTIC CONSULT NOTE - INITIAL  Pharmacy Consult for Vancomycin and Cefepime Indication: pneumonia  Allergies  Allergen Reactions  . Codeine     Patient Measurements: Height: 5' 1.81" (157 cm) Weight: 120 lb (54.432 kg) IBW/kg (Calculated) : 49.67  Vital Signs: Temp: 98.1 F (36.7 C) (02/23 1819) Temp src: Oral (02/23 1819) BP: 118/65 mmHg (02/23 2100) Pulse Rate: 106 (02/23 2100) Intake/Output from previous day:   Intake/Output from this shift:    Labs:  Recent Labs  05-29-13 1834  WBC 16.8*  HGB 11.6*  PLT 253  CREATININE 0.70   Estimated Creatinine Clearance: 38.9 ml/min (by C-G formula based on Cr of 0.7).  Microbiology: No results found for this or any previous visit (from the past 720 hour(s)).  Medical History: No past medical history on file.  Assessment: 89yof presents to the ED from SNF with bilateral leg swelling and lung crackles. CXR shows LLL opacity concerning for pneumonia. She will begin vancomycin and cefepime for possible HCAP. Renal function wnl for her age.  Goal of Therapy:  Vancomycin trough level 15-20 mcg/ml  Plan:  1) Vancomycin 750mg  IV q24 2) Cefepime 1g IV q24 3) Follow renal function, cultures, LOT, level as needed  Deboraha Sprang 05/29/13,9:29 PM

## 2013-05-10 ENCOUNTER — Encounter (HOSPITAL_COMMUNITY): Payer: Self-pay | Admitting: Emergency Medicine

## 2013-05-10 DIAGNOSIS — G309 Alzheimer's disease, unspecified: Secondary | ICD-10-CM

## 2013-05-10 DIAGNOSIS — K219 Gastro-esophageal reflux disease without esophagitis: Secondary | ICD-10-CM

## 2013-05-10 DIAGNOSIS — J189 Pneumonia, unspecified organism: Secondary | ICD-10-CM

## 2013-05-10 DIAGNOSIS — I369 Nonrheumatic tricuspid valve disorder, unspecified: Secondary | ICD-10-CM

## 2013-05-10 DIAGNOSIS — I872 Venous insufficiency (chronic) (peripheral): Secondary | ICD-10-CM

## 2013-05-10 DIAGNOSIS — F0280 Dementia in other diseases classified elsewhere without behavioral disturbance: Secondary | ICD-10-CM

## 2013-05-10 DIAGNOSIS — I4891 Unspecified atrial fibrillation: Secondary | ICD-10-CM

## 2013-05-10 DIAGNOSIS — Z950 Presence of cardiac pacemaker: Secondary | ICD-10-CM

## 2013-05-10 DIAGNOSIS — I5033 Acute on chronic diastolic (congestive) heart failure: Principal | ICD-10-CM | POA: Diagnosis present

## 2013-05-10 LAB — GLUCOSE, CAPILLARY
Glucose-Capillary: 128 mg/dL — ABNORMAL HIGH (ref 70–99)
Glucose-Capillary: 111 mg/dL — ABNORMAL HIGH (ref 70–99)
Glucose-Capillary: 120 mg/dL — ABNORMAL HIGH (ref 70–99)
Glucose-Capillary: 85 mg/dL (ref 70–99)

## 2013-05-10 LAB — TROPONIN I
Troponin I: <0.3 ng/mL (ref <0.30)
Troponin I: <0.3 ng/mL (ref <0.30)

## 2013-05-10 LAB — CBC
HCT: 31.8 % — ABNORMAL LOW (ref 36.0–46.0)
HEMOGLOBIN: 10.3 g/dL — AB (ref 12.0–15.0)
MCH: 30.4 pg (ref 26.0–34.0)
MCHC: 32.4 g/dL (ref 30.0–36.0)
MCV: 93.8 fL (ref 78.0–100.0)
PLATELETS: 232 10*3/uL (ref 150–400)
RBC: 3.39 MIL/uL — AB (ref 3.87–5.11)
RDW: 16.1 % — ABNORMAL HIGH (ref 11.5–15.5)
WBC: 13.6 10*3/uL — ABNORMAL HIGH (ref 4.0–10.5)

## 2013-05-10 LAB — BASIC METABOLIC PANEL
BUN: 19 mg/dL (ref 6–23)
CALCIUM: 8.8 mg/dL (ref 8.4–10.5)
CO2: 28 meq/L (ref 19–32)
CREATININE: 0.71 mg/dL (ref 0.50–1.10)
Chloride: 101 mEq/L (ref 96–112)
GFR calc Af Amer: 87 mL/min — ABNORMAL LOW (ref >90)
GFR calc non Af Amer: 75 mL/min — ABNORMAL LOW (ref >90)
GLUCOSE: 117 mg/dL — AB (ref 70–99)
Potassium: 4.3 mEq/L (ref 3.7–5.3)
Sodium: 141 mEq/L (ref 137–147)

## 2013-05-10 LAB — MRSA PCR SCREENING: MRSA by PCR: NEGATIVE

## 2013-05-10 LAB — STREP PNEUMONIAE URINARY ANTIGEN: STREP PNEUMO URINARY ANTIGEN: NEGATIVE

## 2013-05-10 MED ORDER — FUROSEMIDE 20 MG PO TABS
20.0000 mg | ORAL_TABLET | Freq: Every day | ORAL | Status: DC
  Filled 2013-05-10: qty 1

## 2013-05-10 MED ORDER — DIGOXIN 125 MCG PO TABS
0.1250 mg | ORAL_TABLET | Freq: Every day | ORAL | Status: DC
  Administered 2013-05-10: 0.125 mg via ORAL
  Filled 2013-05-10: qty 1

## 2013-05-10 MED ORDER — HALOPERIDOL LACTATE 5 MG/ML IJ SOLN
1.0000 mg | Freq: Four times a day (QID) | INTRAMUSCULAR | Status: DC | PRN
  Administered 2013-05-10: 1 mg via INTRAVENOUS
  Filled 2013-05-10: qty 1

## 2013-05-10 MED ORDER — INSULIN ASPART 100 UNIT/ML ~~LOC~~ SOLN: 0.0000 [IU] | Freq: Three times a day (TID) | SUBCUTANEOUS | Status: DC

## 2013-05-10 MED ORDER — ASPIRIN EC 81 MG PO TBEC
81.0000 mg | DELAYED_RELEASE_TABLET | Freq: Every day | ORAL | Status: DC
  Administered 2013-05-10: 81 mg via ORAL
  Filled 2013-05-10: qty 1

## 2013-05-10 MED ORDER — ENOXAPARIN SODIUM 40 MG/0.4ML ~~LOC~~ SOLN
40.0000 mg | Freq: Every day | SUBCUTANEOUS | Status: DC
  Administered 2013-05-10: 40 mg via SUBCUTANEOUS
  Filled 2013-05-10: qty 0.4

## 2013-05-10 MED ORDER — VERAPAMIL HCL ER 240 MG PO TBCR
240.0000 mg | EXTENDED_RELEASE_TABLET | Freq: Every day | ORAL | Status: DC
  Administered 2013-05-10: 240 mg via ORAL
  Filled 2013-05-10: qty 1

## 2013-05-10 MED ORDER — CEFEPIME HCL 1 G IJ SOLR: 1.0000 g | Freq: Three times a day (TID) | INTRAMUSCULAR | Status: DC

## 2013-05-10 MED ORDER — FUROSEMIDE 10 MG/ML IJ SOLN
40.0000 mg | Freq: Two times a day (BID) | INTRAMUSCULAR | Status: DC
  Administered 2013-05-10: 40 mg via INTRAVENOUS
  Filled 2013-05-10: qty 4

## 2013-05-10 MED ORDER — INSULIN ASPART 100 UNIT/ML ~~LOC~~ SOLN: 0.0000 [IU] | Freq: Every day | SUBCUTANEOUS | Status: DC

## 2013-05-11 LAB — LEGIONELLA ANTIGEN, URINE: LEGIONELLA ANTIGEN, URINE: NEGATIVE

## 2013-05-11 NOTE — Discharge Summary (Signed)
Death Summary  Emily Wong AQT:[id number] DOB: 06/04/1923 DOA: 2013-05-28  PCP: No primary provider on file. PCP/Office notified:no  Admit date: May 28, 2013 Date of Death: 05/30/13  Final Diagnoses:  Principal Problem:   HCAP (healthcare-associated pneumonia) Active Problems:   Alzheimer's disease   ATRIAL FIBRILLATION, CHRONIC   GERD   ATRIAL SEPTAL DEFECT   Cardiac pacemaker in situ   Acute on chronic diastolic heart failure    History of present illness:  80 y.o. female with Past medical history of diabetes mellitus, hypertension, atrial fibrillation, CHF, Alzheimer dementia, atrial septal repair. The patient is coming from SNF. The patient presented with complaints of bilateral leg swelling and tachycardia. Due to her dementia the patient is a poor historian but at the time of my evaluation she denied any complaint of fever, chills, headache, cough, chest pain, palpitation, shortness of breath, orthopnea, PND, nausea, vomiting, abdominal pain, diarrhea, constipation, active bleeding, burning urination, dizziness, pedal edema, focal neurological deficit.  As per the documentation available from the skilled nursing facility the patient at his baseline has dementia but has been walking around and fairly active but since last evening was found to be having progressively worsening bilateral lower extended swelling along with elevated heart rate and tachypnea. No further information is available from the nursing home.  The patient has been compliant with her medication has been getting her medication on regular basis there is no significant medication change in last few weeks.   Hospital Course:  HCAP (healthcare-associated pneumonia)? Aspiration PNA/Acute on chronic diastolic heart failure/ATRIAL FIBRILLATION, CHRONIC/Alzheimer's disease: - Started on IV Vanc and Cefepime 05-29-2013.  - Afebrile overnight, tachycardia resolved.  - Sat > 95% on RA. SLP consulted rec dysphagia 3 diet. -  Start IV lasix, monitor electrolytes strict I and O's.  - Fluid restrict. Low sodium diet. No previous weight.  - Rate controlled. Not on anticoagulation, unknown reason, she is very weak in hospital.  - Pt was fed around 6 pm, became tachycardic nurse went in the room. Pt was unresponsive and code blue was called, but as pt was DNR code blue was cancelled. - Family informed. Concern that she might of aspirated. Family informed.    Time:  Signed:  Charlynne Cousins  [facility] 2013-05-30, 2:23 PM

## 2013-05-15 NOTE — Progress Notes (Signed)
[facility] PROGRESS NOTE  Interim History: 80 y.o. female with Past medical history of diabetes mellitus, hypertension, atrial fibrillation, CHF, Alzheimer dementia, atrial septal repair. The patient is coming from SNF. The patient presented with complaints of bilateral leg swelling and tachycardia. Due to her dementia the patient is a poor historian but at the time of my evaluation she denied any complaint of fever, chills, headache, cough, chest pain, palpitation, shortness of breath, orthopnea, PND, nausea, vomiting, abdominal pain, diarrhea, constipation, active bleeding, burning urination, dizziness, pedal edema, focal neurological deficit.  Filed Weights   05/06/2013 2100 05/05/2013 2340 05-11-13 0504  Weight: 54.432 kg (120 lb) 62 kg (136 lb 11 oz) 62 kg (136 lb 11 oz)        Intake/Output Summary (Last 24 hours) at 05-11-13 0932 Last data filed at 11-May-2013 0806  Gross per 24 hour  Intake      0 ml  Output    400 ml  Net   -400 ml     Assessment/Plan: HCAP (healthcare-associated pneumonia) - IV Vanc and Cefepime 05/11/13. - Afebrile overnight, tachycardia resolved. - Sat > 95% on RA.  Acute on chronic diastolic heart failure: - Start IV lasix, monitor electrolytes strict I and O's. - Fluid restrict. Low sodium diet. No previous weight. - 62 Kg->  ATRIAL FIBRILLATION, CHRONIC - Rate controlled. - not on anticoagulation, unknown reason, she is very weak in hospital. Get PT consult.   Alzheimer's disease - stable cont home meds.   Code Status: DNR/DNI based on the documentation from the nursing home  Disposition: Admitted to inpatient in telemetry unit.  Consultants:  none  Procedures: ECHO: none  Antibiotics:  Vanc and cefepime  HPI/Subjective: Confused wanting to get out of bed.  Objective: Filed Vitals:   05/08/2013 2230 05/13/2013 2300 05/11/2013 2340 2013/05/11 0504  BP: 111/65 104/85 134/59 110/52  Pulse: 59 110  75  Temp:   97.8 F (36.6 C) 97.8  F (36.6 C)  TempSrc:   Oral Oral  Resp: 17 20 18 19   Height:      Weight:   62 kg (136 lb 11 oz) 62 kg (136 lb 11 oz)  SpO2: 99% 93% 94% 95%     Exam:  General: Alert, awake, oriented x3, in no acute distress.  HEENT: No bruits, no goiter. + JVD Heart: Regular rate and rhythm, without murmurs, rubs, gallops.  Lungs: Good air movement, crackles b/l. Abdomen: Soft, nontender, nondistended, positive bowel sounds.   Data Reviewed: Basic Metabolic Panel:  Recent Labs Lab 05/03/2013 1834 2013-05-11 0420  NA 142 141  K 4.6 4.3  CL 101 101  CO2 29 28  GLUCOSE 109* 117*  BUN 20 19  CREATININE 0.70 0.71  CALCIUM 9.1 8.8   Liver Function Tests:  Recent Labs Lab 04/24/2013 1834  AST 20  ALT 21  ALKPHOS 83  BILITOT 0.7  PROT 7.4  ALBUMIN 2.9*   No results found for this basename: LIPASE, AMYLASE,  in the last 168 hours No results found for this basename: AMMONIA,  in the last 168 hours CBC:  Recent Labs Lab 04/22/2013 1834 11-May-2013 0420  WBC 16.8* 13.6*  NEUTROABS 14.2*  --   HGB 11.6* 10.3*  HCT 36.3 31.8*  MCV 93.6 93.8  PLT 253 232   Cardiac Enzymes:  Recent Labs Lab 05/13/2013 1834 05/11/2013 0420 05-11-13 0644  TROPONINI <0.30 <0.30 <0.30   BNP (last 3 results)  Recent Labs  05/09/13 1834  PROBNP 2845.0*  CBG:  Recent Labs Lab 04/29/2013 0246 05/05/2013 0538  GLUCAP 128* 111*    Recent Results (from the past 240 hour(s))  MRSA PCR SCREENING     Status: None   Collection Time    05/11/2013  5:22 AM      Result Value Ref Range Status   MRSA by PCR NEGATIVE  NEGATIVE Final   Comment:            The GeneXpert MRSA Assay (FDA     approved for NASAL specimens     only), is one component of a     comprehensive MRSA colonization     surveillance program. It is not     intended to diagnose MRSA     infection nor to guide or     monitor treatment for     MRSA infections.     Studies: Dg Chest 2 View  05/13/2013   CLINICAL DATA:  Lower extremity  edema. Discoloration of the finger tips. Lung crackles. Dementia.  EXAM: CHEST  2 VIEW  COMPARISON:  DG CHEST 2 VIEW dated 05/25/2008; DG CHEST 1V PORT dated 02/28/2008; CT HEAD W/O CM dated 05/25/2008  FINDINGS: Moderate cardiomegaly noted. There is new airspace opacity in the left lower lobe obscuring the left hemidiaphragm. There is potentially a small left pleural effusion. Kerley B-lines are noted peripherally in the lungs, and there is a small amount of fluid in the minor fissure.  Prior median sternotomy. Dual lead pacer noted. Bony demineralization.  IMPRESSION: 1. Left lower lobe airspace opacity potentially from pneumonia or less likely confluent edema with left pleural effusion. Malignancy is not excluded, and followup radiography in the next 4 weeks time is recommended in order to ensure clearance after appropriate treatment. 2. Kerley B-lines in the lungs suggesting mild interstitial edema. Cardiomegaly noted.   Electronically Signed   By: Sherryl Barters M.D.   On: 04/19/2013 19:55    Scheduled Meds: . aspirin EC  81 mg Oral Daily  . ceFEPime (MAXIPIME) IV  1 g Intravenous Q24H  . digoxin  0.125 mg Oral Daily  . enoxaparin (LOVENOX) injection  40 mg Subcutaneous Daily  . furosemide  40 mg Intravenous Q12H  . insulin aspart  0-5 Units Subcutaneous QHS  . insulin aspart  0-9 Units Subcutaneous TID WC  . vancomycin  750 mg Intravenous Q24H  . verapamil  240 mg Oral Daily   Continuous Infusions:    Charlynne Cousins  [facility] Pager [phone] If 8PM-8AM, please contact night-coverage at [email or web address], password [facility] 04/20/2013, 9:37 AM  LOS: 1 day

## 2013-05-15 NOTE — Progress Notes (Signed)
Pt noted not to be  breathing, and no pulse.  Dr. Joya Gaskins at bedside   pronounced pt at 1821.  Dr.  Durel Salts informed and informed pt's family.  Karie Kirks, Therapist, sports.

## 2013-05-15 NOTE — Progress Notes (Signed)
Pt admitted to 3E13 from ER, came by stretcher, no family member at the bedside, pt AO to her self only with Hx of dementia, admission assessment only partial completed, pt unable to give full Hx. Foley catheter started per MD order, pt having urgency and in a very high risk for fall. Bed alarm in place for safety, pt reoriented to her room as needed. Pt denies any pain at this time. Pt looks very anxious and restless at this time. We'll continue with POC.

## 2013-05-15 NOTE — Code Documentation (Signed)
CODE BLUE NOTE  Patient Name: Emily Wong   MRN: [id number]   Date of Birth/ Sex: 10/26/1923 , female      Admission Date: 04/20/2013  Attending Provider: Charlynne Cousins, MD  Primary Diagnosis: CHF (congestive heart failure) [428.0] Atrial fibrillation with RVR [427.31] HCAP (healthcare-associated pneumonia) [486]    Indication: Pt was in her usual state of health until this PM, when she was noted to be unresponsive. Code blue was subsequently called, however canceled as patient is DNR. Physician on scene confirmed no breaths or heartbeat. Patient pronounced at 6:21pm.   Technical Description:  - CPR performance duration:  None    - Was defibrillation or cardioversion used? No   - Was external pacer placed? No  - Was patient intubated pre/post CPR? No    Medications Administered: Y = Yes; Blank = No Amiodarone    Atropine    Calcium    Epinephrine    Lidocaine    Magnesium    Norepinephrine    Phenylephrine    Sodium bicarbonate    Vasopressin    Other     Post CPR evaluation:  - Final Status - Was patient successfully resuscitated ? No   Miscellaneous Information:  - Time of death:  6:21  PM  - Primary team notified?  Yes  - Family Notified?         Tawanna Sat, MD   04/18/2013, 6:24 PM

## 2013-05-15 NOTE — Progress Notes (Signed)
Pt's son Kaydence Menard at bedside with pt.   Instructed to call CN  when ready to leave.  Karie Kirks, Therapist, sports.

## 2013-05-15 NOTE — Progress Notes (Signed)
  Echocardiogram 2D Echocardiogram has been performed.  Basilia Jumbo 04/23/2013, 1:41 PM

## 2013-05-15 NOTE — Progress Notes (Signed)
Bilateral lower extremity venous duplex:  No evidence of DVT, superficial thrombosis, or Baker's Cyst.   

## 2013-05-15 NOTE — Evaluation (Signed)
Clinical/Bedside Swallow Evaluation Patient Details  Name: Emily Wong MRN: [id number] Date of Birth: 17-Oct-1923  Today's Date: 05/08/2013 Time: 9326-7124 SLP Time Calculation (min): 16 min  Past Medical History:  Past Medical History  Diagnosis Date  . Diabetes mellitus without complication   . Hypertension   . CHF (congestive heart failure)   . Dementia    Past Surgical History: History reviewed. No pertinent past surgical history. HPI:  Emily Wong is a 80 y.o. female with Past medical history of diabetes mellitus, hypertension, atrial fibrillation, CHF, Alzheimer dementia, atrial septal repair. The patient is coming from SNF. The patient presented with complaints of bilateral leg swelling and tachycardia. As per the documentation available from the skilled nursing facility the patient at his baseline has dementia but has been walking around and fairly active but since last evening was found to be having progressively worsening bilateral lower extended swelling along with elevated heart rate and tachypnea. She was also found to have left sided pulmonary opacity infiltrate vs edema.    Assessment / Plan / Recommendation Clinical Impression  Pt presents with mild difficulty chewing due to missing dentition and lack of dentures. Otherwise there are no overt signs of dysphagia or aspiration; swallow response is timely and strong. Pt has no history of dysphagia in our system. Recommend a dys 3 (mech soft) diet with thin liquids and no f/u unless MD strongly favors silent aspiration based on clinical picture. If so, order MBS.     Aspiration Risk  Mild    Diet Recommendation Dysphagia 3 (Mechanical Soft);Thin liquid   Liquid Administration via: Cup;Straw Medication Administration: Whole meds with liquid Supervision: Patient able to self feed Compensations: Slow rate;Small sips/bites Postural Changes and/or Swallow Maneuvers: Seated upright 90 degrees    Other  Recommendations  Oral Care Recommendations: Oral care BID   Follow Up Recommendations  None    Frequency and Duration        Pertinent Vitals/Pain NA    SLP Swallow Goals     Swallow Study Prior Functional Status       General HPI: Emily Wong is a 80 y.o. female with Past medical history of diabetes mellitus, hypertension, atrial fibrillation, CHF, Alzheimer dementia, atrial septal repair. The patient is coming from SNF. The patient presented with complaints of bilateral leg swelling and tachycardia. As per the documentation available from the skilled nursing facility the patient at his baseline has dementia but has been walking around and fairly active but since last evening was found to be having progressively worsening bilateral lower extended swelling along with elevated heart rate and tachypnea. She was also found to have left sided pulmonary opacity infiltrate vs edema.  Type of Study: Bedside swallow evaluation Previous Swallow Assessment: none in chart Diet Prior to this Study: NPO Respiratory Status: Nasal cannula History of Recent Intubation: No Behavior/Cognition: Alert;Cooperative;Pleasant mood;Confused Oral Cavity - Dentition: Missing dentition;Poor condition Self-Feeding Abilities: Able to feed self Patient Positioning: Upright in bed Baseline Vocal Quality: Clear Volitional Cough: Congested Volitional Swallow: Able to elicit    Oral/Motor/Sensory Function Overall Oral Motor/Sensory Function: Appears within functional limits for tasks assessed   Ice Chips Ice chips: Within functional limits   Thin Liquid Thin Liquid: Within functional limits Presentation: Cup;Straw    Nectar Thick Nectar Thick Liquid: Not tested   Honey Thick Honey Thick Liquid: Not tested   Puree Puree: Within functional limits   Solid   GO    Solid: Impaired Presentation: Self Fed Oral  Phase Impairments: Impaired mastication (missing dentition)      Herbie Baltimore, MA CCC-SLP [phone]  Jeziah Kretschmer,  Katherene Ponto 06/06/13,8:47 AM

## 2013-05-15 NOTE — Progress Notes (Signed)
UR completed Asmar Brozek K. Lebaron Bautch, RN, BSN, MSHL, CCM  05-11-13 10:44 AM

## 2013-05-15 NOTE — Care Management Note (Addendum)
  Page 1 of 1   04/24/2013     10:47:44 AM   CARE MANAGEMENT NOTE 04/20/2013  Patient:  Emily Wong, Emily Wong   Account Number:  [id number]  Date Initiated:  04/25/2013  Documentation initiated by:  Loras Grieshop  Subjective/Objective Assessment:   Admitted with HCAP, AMS, Dementia, Bilateral lower extremity cellulitis.  From SNF.     Action/Plan:   CM to follow for disposition needs.   Anticipated DC Date:  05/13/2013   Anticipated DC Plan:  SKILLED NURSING FACILITY  In-house referral  Clinical Social Worker         Choice offered to / List presented to:             Status of service:  In process, will continue to follow Medicare Important Message given?   (If response is "NO", the following Medicare IM given date fields will be blank) Date Medicare IM given:   Date Additional Medicare IM given:    Discharge Disposition:    Per UR Regulation:  Reviewed for med. necessity/level of care/duration of stay  If discussed at [facility] of Stay Meetings, dates discussed:    Comments:

## 2013-05-15 NOTE — H&P (Signed)
[facility] History and Physical  Patient: Emily Wong  [id number]  DOB: [birth year]  DOS: the patient was seen and examined on 06/04/13 PCP: No primary provider on file.  Chief Complaint: Bilateral leg swelling  HPI: Emily Wong is a 80 y.o. female with Past medical history of diabetes mellitus, hypertension, atrial fibrillation, CHF, Alzheimer dementia, atrial septal repair. The patient is coming from SNF. The patient presented with complaints of bilateral leg swelling and tachycardia. Due to her dementia the patient is a poor historian but at the time of my evaluation she denied any complaint of fever, chills, headache, cough, chest pain, palpitation, shortness of breath, orthopnea, PND, nausea, vomiting, abdominal pain, diarrhea, constipation, active bleeding, burning urination, dizziness, pedal edema,  focal neurological deficit.  As per the documentation available from the skilled nursing facility the patient at his baseline has dementia but has been walking around and fairly active but since last evening was found to be having progressively worsening bilateral lower extended swelling along with elevated heart rate and tachypnea. No further information is available from the nursing home. The patient has been compliant with her medication has been getting her medication on regular basis there is no significant medication change in last few weeks.  Review of Systems: as mentioned in the history of present illness.  A Comprehensive review of the other systems is negative.  Past Medical History  Diagnosis Date  . Diabetes mellitus without complication   . Hypertension   . CHF (congestive heart failure)   . Dementia    History reviewed. No pertinent past surgical history. Social History:  reports that she has quit smoking. She has quit using smokeless tobacco. Her alcohol and drug histories are not on file. Partially Independent for most of her  ADL.  Allergies   Allergen Reactions  . Codeine     History reviewed. No pertinent family history.  Prior to Admission medications   Medication Sig Start Date End Date Taking? Authorizing Provider  aspirin EC 81 MG tablet Take 81 mg by mouth daily.   Yes Historical Provider, MD  digoxin (LANOXIN) 0.125 MG tablet Take 0.125 mg by mouth daily.   Yes Historical Provider, MD  furosemide (LASIX) 20 MG tablet Take 20 mg by mouth daily.   Yes Historical Provider, MD  hydrocerin (EUCERIN) CREA Apply 1 application topically 2 (two) times daily. To legs and feet   Yes Historical Provider, MD  Melatonin 3 MG TABS Take 1 tablet by mouth at bedtime.   Yes Historical Provider, MD  metFORMIN (GLUCOPHAGE) 500 MG tablet Take 500 mg by mouth 2 (two) times daily with a meal.   Yes Historical Provider, MD  Multiple Vitamin (TAB-A-VITE PO) Take 1 tablet by mouth daily.   Yes Historical Provider, MD  potassium chloride SA (K-DUR,KLOR-CON) 20 MEQ tablet Take 20 mEq by mouth 2 (two) times daily.   Yes Historical Provider, MD  verapamil (CALAN-SR) 240 MG CR tablet Take 240 mg by mouth daily.   Yes Historical Provider, MD    Physical Exam: Filed Vitals:   05/11/2013 2100 05/01/2013 2230 05/08/2013 2300 05/09/2013 2340  BP: 118/65 111/65 104/85 134/59  Pulse: 106 59 110   Temp:    97.8 F (36.6 C)  TempSrc:    Oral  Resp: 24 17 20 18   Height: 5' 1.81" (1.57 m)     Weight: 54.432 kg (120 lb)   62 kg (136 lb 11 oz)  SpO2: 97% 99% 93% 94%  General: Alert, Awake and Oriented to Place and Person. Appear in mild distress Eyes: PERRL ENT: Oral Mucosa clear moist Neck: Mild JVD Cardiovascular: S1 and S2 Present, no Murmur, Peripheral Pulses Present Respiratory: Bilateral Air entry equal and Decreased, left basal Crackles, no wheezes Abdomen: Bowel Sound Present, Soft and Non tender Skin: No Rash Extremities: Bilateral lower extremity redness and Pedal edema, no calf tenderness Neurologic: Grossly Unremarkable. Labs on Admission:   CBC:  Recent Labs Lab 05-14-2013 1834  WBC 16.8*  NEUTROABS 14.2*  HGB 11.6*  HCT 36.3  MCV 93.6  PLT 253    CMP     Component Value Date/Time   NA 142 05-14-13 1834   K 4.6 05-14-13 1834   CL 101 05/14/13 1834   CO2 29 2013-05-14 1834   GLUCOSE 109* May 14, 2013 1834   BUN 20 May 14, 2013 1834   CREATININE 0.70 14-May-2013 1834   CALCIUM 9.1 05-14-2013 1834   PROT 7.4 14-May-2013 1834   ALBUMIN 2.9* 14-May-2013 1834   AST 20 05-14-2013 1834   ALT 21 05/14/13 1834   ALKPHOS 83 May 14, 2013 1834   BILITOT 0.7 May 14, 2013 1834   GFRNONAA 76* 05-14-2013 1834   GFRAA 88* May 14, 2013 1834    No results found for this basename: LIPASE, AMYLASE,  in the last 168 hours No results found for this basename: AMMONIA,  in the last 168 hours   Recent Labs Lab May 14, 2013 1834  TROPONINI <0.30   BNP (last 3 results)  Recent Labs  05-14-2013 1834  PROBNP 2845.0*    Radiological Exams on Admission: Dg Chest 2 View  05/14/13   CLINICAL DATA:  Lower extremity edema. Discoloration of the finger tips. Lung crackles. Dementia.  EXAM: CHEST  2 VIEW  COMPARISON:  DG CHEST 2 VIEW dated 05/25/2008; DG CHEST 1V PORT dated 02/28/2008; CT HEAD W/O CM dated 05/25/2008  FINDINGS: Moderate cardiomegaly noted. There is new airspace opacity in the left lower lobe obscuring the left hemidiaphragm. There is potentially a small left pleural effusion. Kerley B-lines are noted peripherally in the lungs, and there is a small amount of fluid in the minor fissure.  Prior median sternotomy. Dual lead pacer noted. Bony demineralization.  IMPRESSION: 1. Left lower lobe airspace opacity potentially from pneumonia or less likely confluent edema with left pleural effusion. Malignancy is not excluded, and followup radiography in the next 4 weeks time is recommended in order to ensure clearance after appropriate treatment. 2. Kerley B-lines in the lungs suggesting mild interstitial edema. Cardiomegaly noted.   Electronically Signed    By: Sherryl Barters M.D.   On: 2013/05/14 19:55    EKG: Independently reviewed. atrial fibrillation, rate controlled.  Assessment/Plan Principal Problem:   HCAP (healthcare-associated pneumonia) Active Problems:   Alzheimer's disease   ATRIAL FIBRILLATION, CHRONIC   GERD   ATRIAL SEPTAL DEFECT   Cardiac pacemaker in situ   1. HCAP (healthcare-associated pneumonia) The patient is presenting with complaints of bilateral leg swelling but on further evaluation she found to be having shortness of breath, cough and left-sided crackles with chest x-rays history of left-sided infiltrate. With this she will be admitted for hives get a surgical pneumonia and was treated with IV vancomycin IV cefepime, blood cultures sputum cultures urine antigens will be checked Oxygen as needed Flutter valve Speech evaluation for possible dysphagia  2. Chronic atrial fibrillation, acute and chronic decompensated diastolic dysfunction Patient presented with bilateral leg swelling her chest x-ray also suggestive of possible pulmonary venous congestion It is unclear whether  she has gained some weight but she is compliant with her Lasix at her baseline She has received one dose of IV Lasix in the ED and I would continue her on Lasix 40 mg twice a day for tomorrow Monitor BMP echocardiogram in the morning Foley catheter at present for close monitoring of urine output Continue aspirin and verapamil for rate control  3. Alzheimer dementia The patient appears reveals, vancomycin and cefepime will be with possible UTI Appears at fall risk Foley catheter will help reducing recurrent movement restroom leading to fal  DVT Prophylaxis: subcutaneous Heparin Nutrition: N.p.o. except medications until speech evaluation done  Code Status: DNR/DNI based on the documentation from the nursing home  Disposition: Admitted to inpatient in telemetry unit.  Author: Berle Mull, MD [facility] Pager:  [phone] 05/23/13, 2:38 AM    If 7PM-7AM, please contact night-coverage [email or web address] Password [email or web address]

## 2013-05-15 NOTE — Progress Notes (Signed)
PT Cancellation Note  Patient Details Name: Emily Wong MRN: [id number] DOB: [birth year]   Cancelled Treatment:    Reason Eval/Treat Not Completed: Patient at procedure or test/unavailable.  Dopplers and Echo.  Will reattempt as able.  Thanks.     INGOLD,Grisell Bissette 05/09/2013, 3:18 PM [facility] [phone] (pager)

## 2013-05-15 DEATH — deceased

## 2013-05-16 LAB — CULTURE, BLOOD (ROUTINE X 2)
CULTURE: NO GROWTH
Culture: NO GROWTH

## 2014-06-18 IMAGING — CR DG CHEST 2V
2 series · 2 of 2 positions shown · non-contrast
Comparison: DG CHEST 2 VIEW dated 05/25/2008; DG CHEST 1V PORT dated
02/28/2008; CT HEAD W/O CM dated 05/25/2008

CLINICAL DATA: Lower extremity edema. Discoloration of the finger
tips. Lung crackles. Dementia.

EXAM:
CHEST  2 VIEW

[w chest lat]
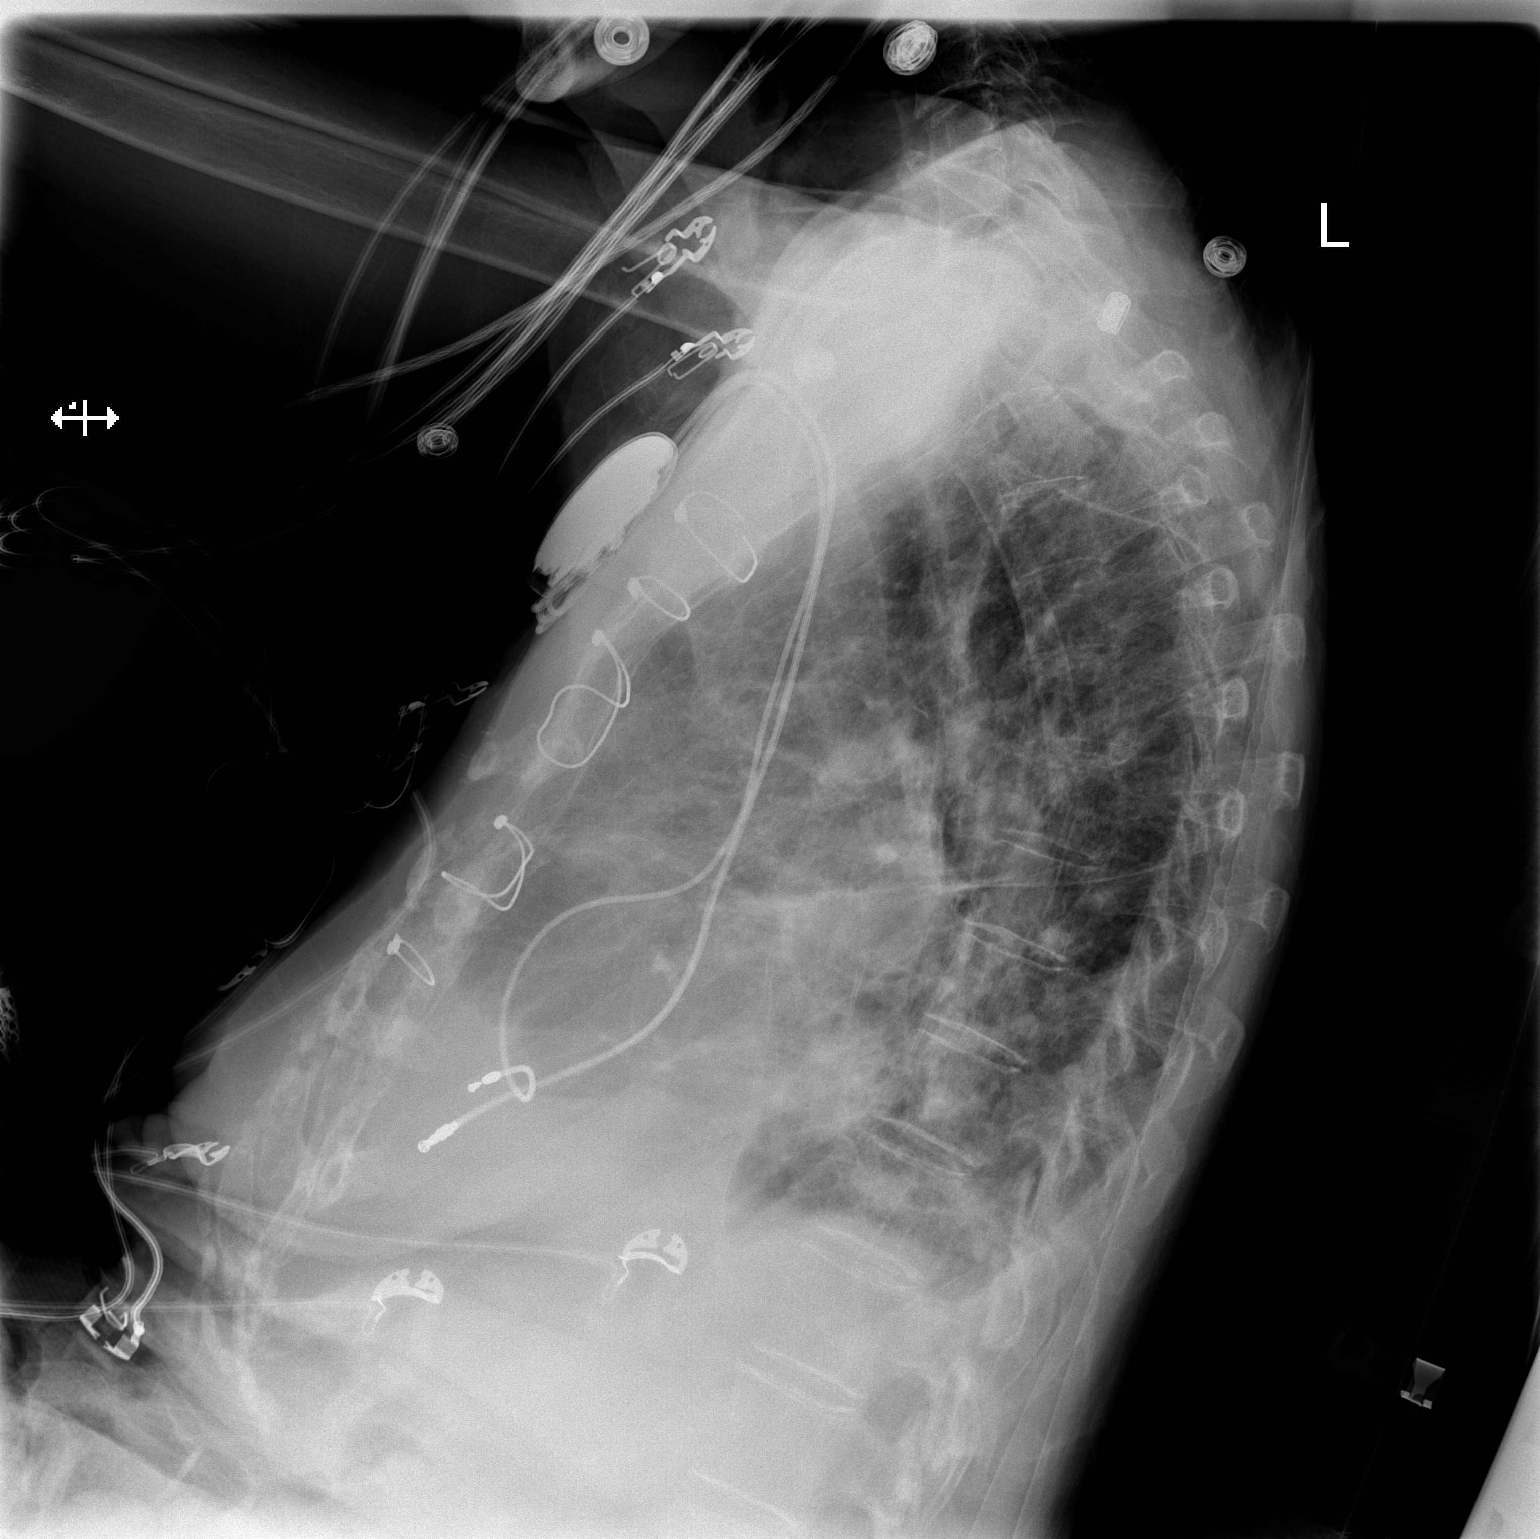

[x chest ap]
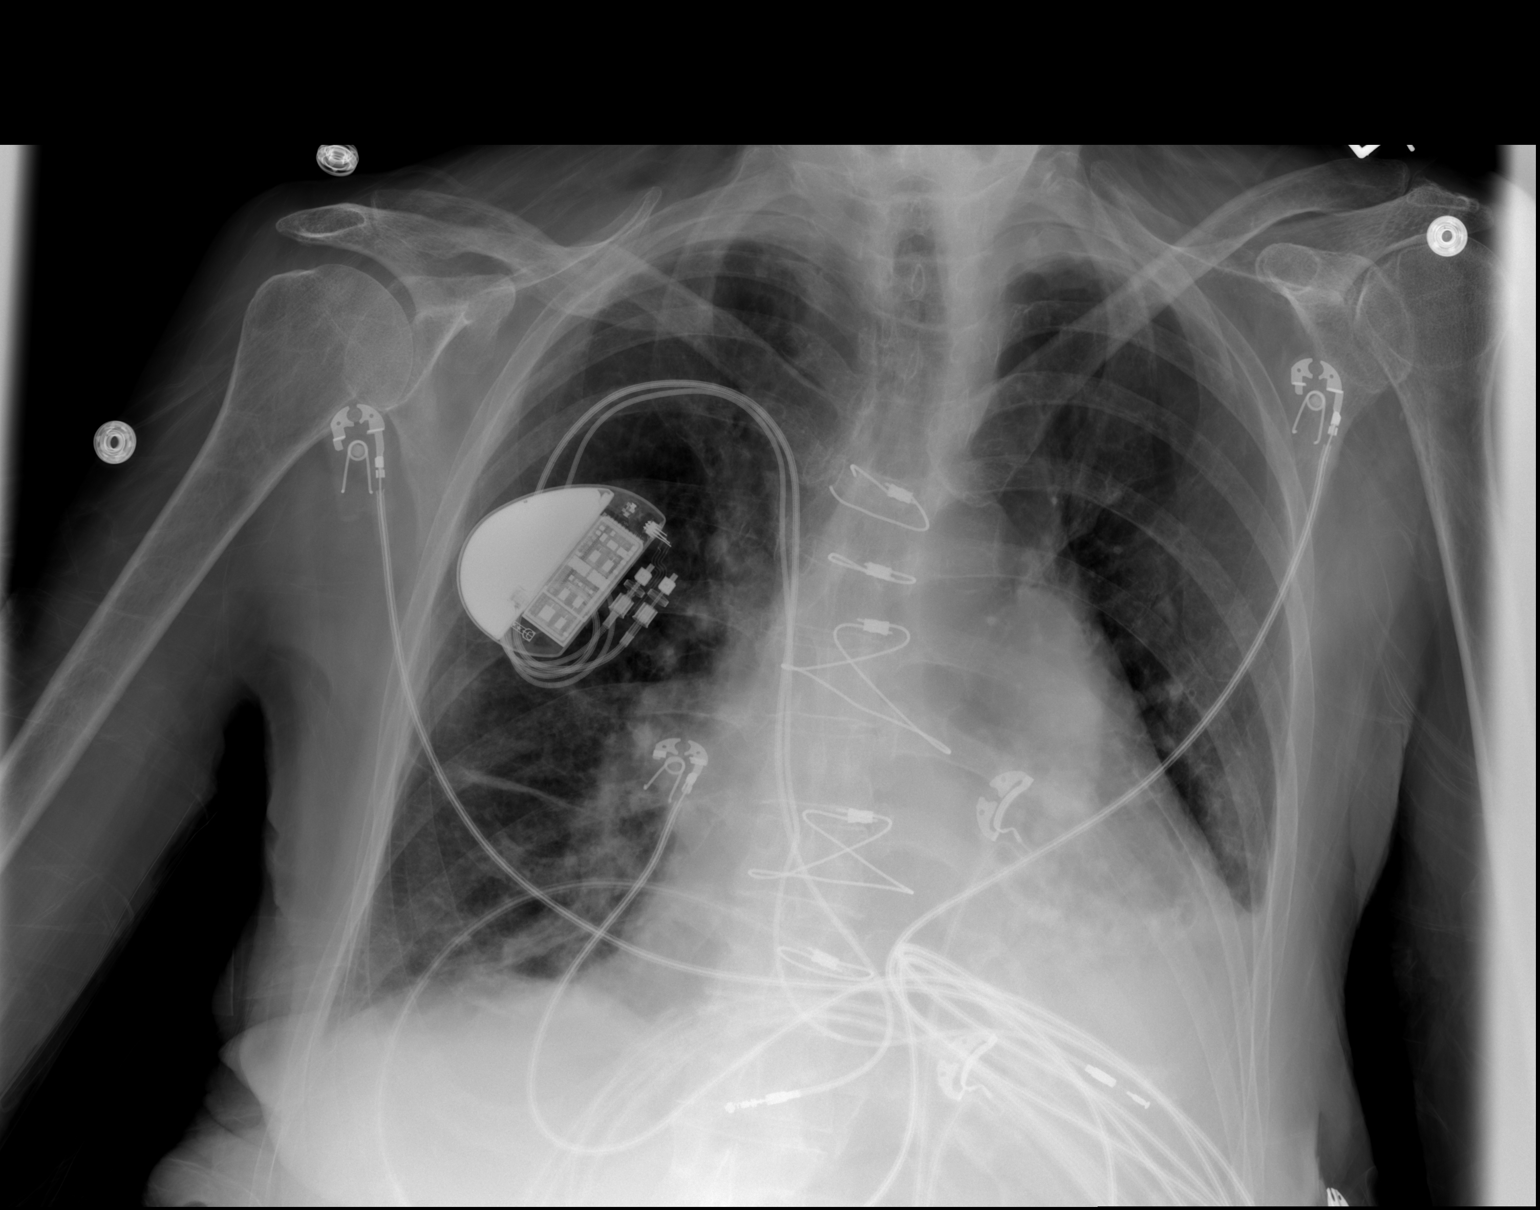

[2 of 2 positions shown; findings below may reference images not displayed]

FINDINGS: Moderate cardiomegaly noted. There is new airspace opacity in the
left lower lobe obscuring the left hemidiaphragm. There is
potentially a small left pleural effusion. Kerley B-lines are noted
peripherally in the lungs, and there is a small amount of fluid in
the minor fissure.

Prior median sternotomy. Dual lead pacer noted. Bony
demineralization.
IMPRESSION: 1. Left lower lobe airspace opacity potentially from pneumonia or
less likely confluent edema with left pleural effusion. Malignancy
is not excluded, and followup radiography in the next 4 weeks time
is recommended in order to ensure clearance after appropriate
treatment.
2. Kerley B-lines in the lungs suggesting mild interstitial edema.
Cardiomegaly noted.
